# Patient Record
Sex: Female | Born: 1950
Health system: Southern US, Community
[De-identification: ages and names within clinical notes are randomized; demographics above are authoritative.]

## PROBLEM LIST (undated history)

## (undated) DIAGNOSIS — F32A Depression, unspecified: Secondary | ICD-10-CM

## (undated) DIAGNOSIS — K529 Noninfective gastroenteritis and colitis, unspecified: Secondary | ICD-10-CM

## (undated) DIAGNOSIS — I10 Essential (primary) hypertension: Secondary | ICD-10-CM

## (undated) DIAGNOSIS — J449 Chronic obstructive pulmonary disease, unspecified: Secondary | ICD-10-CM

## (undated) DIAGNOSIS — R079 Chest pain, unspecified: Secondary | ICD-10-CM

## (undated) DIAGNOSIS — F419 Anxiety disorder, unspecified: Secondary | ICD-10-CM

## (undated) DIAGNOSIS — F329 Major depressive disorder, single episode, unspecified: Secondary | ICD-10-CM

## (undated) DIAGNOSIS — M898X9 Other specified disorders of bone, unspecified site: Secondary | ICD-10-CM

## (undated) HISTORY — PX: COLONOSCOPY: SHX174

## (undated) HISTORY — PX: ABDOMINAL HYSTERECTOMY: SHX81

---
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
## 2019-06-24 ENCOUNTER — Encounter (HOSPITAL_BASED_OUTPATIENT_CLINIC_OR_DEPARTMENT_OTHER): Payer: Self-pay

## 2019-06-24 ENCOUNTER — Other Ambulatory Visit: Payer: Self-pay

## 2019-06-24 ENCOUNTER — Observation Stay (HOSPITAL_BASED_OUTPATIENT_CLINIC_OR_DEPARTMENT_OTHER)
Admission: EM | Admit: 2019-06-24 | Discharge: 2019-06-26 | Disposition: A | Discharge status: Home / Self Care | Payer: Medicare Other | Attending: Internal Medicine | Admitting: Internal Medicine

## 2019-06-24 DIAGNOSIS — F419 Anxiety disorder, unspecified: Secondary | ICD-10-CM | POA: Insufficient documentation

## 2019-06-24 DIAGNOSIS — R079 Chest pain, unspecified: Secondary | ICD-10-CM | POA: Insufficient documentation

## 2019-06-24 DIAGNOSIS — E611 Iron deficiency: Secondary | ICD-10-CM | POA: Diagnosis not present

## 2019-06-24 DIAGNOSIS — R718 Other abnormality of red blood cells: Secondary | ICD-10-CM | POA: Diagnosis not present

## 2019-06-24 DIAGNOSIS — Z90710 Acquired absence of both cervix and uterus: Secondary | ICD-10-CM | POA: Diagnosis not present

## 2019-06-24 DIAGNOSIS — Z87891 Personal history of nicotine dependence: Secondary | ICD-10-CM | POA: Insufficient documentation

## 2019-06-24 DIAGNOSIS — Z9981 Dependence on supplemental oxygen: Secondary | ICD-10-CM | POA: Diagnosis not present

## 2019-06-24 DIAGNOSIS — F329 Major depressive disorder, single episode, unspecified: Secondary | ICD-10-CM | POA: Diagnosis not present

## 2019-06-24 DIAGNOSIS — Z7982 Long term (current) use of aspirin: Secondary | ICD-10-CM | POA: Insufficient documentation

## 2019-06-24 DIAGNOSIS — J449 Chronic obstructive pulmonary disease, unspecified: Secondary | ICD-10-CM | POA: Diagnosis not present

## 2019-06-24 DIAGNOSIS — E876 Hypokalemia: Secondary | ICD-10-CM | POA: Diagnosis present

## 2019-06-24 DIAGNOSIS — Z79899 Other long term (current) drug therapy: Secondary | ICD-10-CM | POA: Insufficient documentation

## 2019-06-24 DIAGNOSIS — N183 Chronic kidney disease, stage 3 unspecified: Secondary | ICD-10-CM | POA: Diagnosis present

## 2019-06-24 DIAGNOSIS — R072 Precordial pain: Secondary | ICD-10-CM

## 2019-06-24 DIAGNOSIS — Z1159 Encounter for screening for other viral diseases: Secondary | ICD-10-CM | POA: Insufficient documentation

## 2019-06-24 DIAGNOSIS — I10 Essential (primary) hypertension: Secondary | ICD-10-CM | POA: Diagnosis present

## 2019-06-24 DIAGNOSIS — I129 Hypertensive chronic kidney disease with stage 1 through stage 4 chronic kidney disease, or unspecified chronic kidney disease: Secondary | ICD-10-CM | POA: Insufficient documentation

## 2019-06-24 HISTORY — DX: Noninfective gastroenteritis and colitis, unspecified: K52.9

## 2019-06-24 HISTORY — DX: Other specified disorders of bone, unspecified site: M89.8X9

## 2019-06-24 HISTORY — DX: Anxiety disorder, unspecified: F41.9

## 2019-06-24 HISTORY — DX: Essential (primary) hypertension: I10

## 2019-06-24 HISTORY — DX: Chronic obstructive pulmonary disease, unspecified: J44.9

## 2019-06-24 HISTORY — DX: Depression, unspecified: F32.A

## 2019-06-24 NOTE — ED Triage Notes (Signed)
Chest pain started 35 minutes prior to arrival. Describes as pressure. Constant, radiation to right arm. Dizziness, no nausea or vomiting.

## 2019-06-25 ENCOUNTER — Other Ambulatory Visit: Payer: Self-pay

## 2019-06-25 ENCOUNTER — Encounter (HOSPITAL_BASED_OUTPATIENT_CLINIC_OR_DEPARTMENT_OTHER): Payer: Self-pay | Admitting: Emergency Medicine

## 2019-06-25 ENCOUNTER — Observation Stay (HOSPITAL_BASED_OUTPATIENT_CLINIC_OR_DEPARTMENT_OTHER): Payer: Medicare Other

## 2019-06-25 ENCOUNTER — Emergency Department (HOSPITAL_BASED_OUTPATIENT_CLINIC_OR_DEPARTMENT_OTHER): Payer: Medicare Other

## 2019-06-25 DIAGNOSIS — N183 Chronic kidney disease, stage 3 unspecified: Secondary | ICD-10-CM | POA: Diagnosis present

## 2019-06-25 DIAGNOSIS — Z1159 Encounter for screening for other viral diseases: Secondary | ICD-10-CM | POA: Diagnosis not present

## 2019-06-25 DIAGNOSIS — E611 Iron deficiency: Secondary | ICD-10-CM | POA: Diagnosis not present

## 2019-06-25 DIAGNOSIS — R079 Chest pain, unspecified: Secondary | ICD-10-CM

## 2019-06-25 DIAGNOSIS — E876 Hypokalemia: Secondary | ICD-10-CM | POA: Diagnosis not present

## 2019-06-25 DIAGNOSIS — R718 Other abnormality of red blood cells: Secondary | ICD-10-CM | POA: Diagnosis not present

## 2019-06-25 DIAGNOSIS — I10 Essential (primary) hypertension: Secondary | ICD-10-CM | POA: Diagnosis present

## 2019-06-25 DIAGNOSIS — Z87891 Personal history of nicotine dependence: Secondary | ICD-10-CM | POA: Diagnosis not present

## 2019-06-25 DIAGNOSIS — Z7982 Long term (current) use of aspirin: Secondary | ICD-10-CM | POA: Diagnosis not present

## 2019-06-25 DIAGNOSIS — F329 Major depressive disorder, single episode, unspecified: Secondary | ICD-10-CM | POA: Diagnosis not present

## 2019-06-25 DIAGNOSIS — Z90710 Acquired absence of both cervix and uterus: Secondary | ICD-10-CM | POA: Diagnosis not present

## 2019-06-25 DIAGNOSIS — Z79899 Other long term (current) drug therapy: Secondary | ICD-10-CM | POA: Diagnosis not present

## 2019-06-25 DIAGNOSIS — I129 Hypertensive chronic kidney disease with stage 1 through stage 4 chronic kidney disease, or unspecified chronic kidney disease: Secondary | ICD-10-CM | POA: Diagnosis not present

## 2019-06-25 DIAGNOSIS — F419 Anxiety disorder, unspecified: Secondary | ICD-10-CM | POA: Diagnosis present

## 2019-06-25 DIAGNOSIS — J449 Chronic obstructive pulmonary disease, unspecified: Secondary | ICD-10-CM | POA: Diagnosis not present

## 2019-06-25 DIAGNOSIS — Z9981 Dependence on supplemental oxygen: Secondary | ICD-10-CM | POA: Diagnosis not present

## 2019-06-25 HISTORY — DX: Chest pain, unspecified: R07.9

## 2019-06-25 LAB — CBC WITH DIFFERENTIAL/PLATELET
Abs Immature Granulocytes: 0.01 10*3/uL (ref 0.00–0.07)
Basophils Absolute: 0 10*3/uL (ref 0.0–0.1)
Basophils Relative: 1 %
Eosinophils Absolute: 0.3 10*3/uL (ref 0.0–0.5)
Eosinophils Relative: 5 %
HCT: 41.2 % (ref 36.0–46.0)
Hemoglobin: 12.2 g/dL (ref 12.0–15.0)
Immature Granulocytes: 0 %
Lymphocytes Relative: 43 %
Lymphs Abs: 2.7 10*3/uL (ref 0.7–4.0)
MCH: 22.1 pg — ABNORMAL LOW (ref 26.0–34.0)
MCHC: 29.6 g/dL — ABNORMAL LOW (ref 30.0–36.0)
MCV: 74.8 fL — ABNORMAL LOW (ref 80.0–100.0)
Monocytes Absolute: 0.6 10*3/uL (ref 0.1–1.0)
Monocytes Relative: 9 %
Neutro Abs: 2.7 10*3/uL (ref 1.7–7.7)
Neutrophils Relative %: 42 %
Platelets: 284 10*3/uL (ref 150–400)
RBC: 5.51 MIL/uL — ABNORMAL HIGH (ref 3.87–5.11)
RDW: 16.1 % — ABNORMAL HIGH (ref 11.5–15.5)
WBC: 6.4 10*3/uL (ref 4.0–10.5)
nRBC: 0 % (ref 0.0–0.2)

## 2019-06-25 LAB — ECHOCARDIOGRAM COMPLETE
Height: 64 in
Weight: 3104 oz

## 2019-06-25 LAB — SARS CORONAVIRUS 2 BY RT PCR (HOSPITAL ORDER, PERFORMED IN ~~LOC~~ HOSPITAL LAB): SARS Coronavirus 2: NEGATIVE

## 2019-06-25 LAB — BASIC METABOLIC PANEL
Anion gap: 10 (ref 5–15)
BUN: 19 mg/dL (ref 8–23)
CO2: 20 mmol/L — ABNORMAL LOW (ref 22–32)
Calcium: 9.6 mg/dL (ref 8.9–10.3)
Chloride: 110 mmol/L (ref 98–111)
Creatinine, Ser: 1.42 mg/dL — ABNORMAL HIGH (ref 0.44–1.00)
GFR calc Af Amer: 45 mL/min — ABNORMAL LOW (ref >60)
GFR calc non Af Amer: 39 mL/min — ABNORMAL LOW (ref >60)
Glucose, Bld: 93 mg/dL (ref 70–99)
Potassium: 3.2 mmol/L — ABNORMAL LOW (ref 3.5–5.1)
Sodium: 140 mmol/L (ref 135–145)

## 2019-06-25 LAB — TROPONIN I (HIGH SENSITIVITY)
Troponin I (High Sensitivity): 7 ng/L (ref <18)
Troponin I (High Sensitivity): 8 ng/L (ref <18)

## 2019-06-25 MED ORDER — HYDROXYZINE HCL 10 MG PO TABS
10.0000 mg | ORAL_TABLET | Freq: Every day | ORAL | Status: DC
  Administered 2019-06-25: 10 mg via ORAL
  Filled 2019-06-25: qty 1

## 2019-06-25 MED ORDER — MESALAMINE ER 0.375 G PO CP24: 0.3750 g | ORAL_CAPSULE | Freq: Every day | ORAL | Status: DC

## 2019-06-25 MED ORDER — CLONAZEPAM 0.5 MG PO TABS
0.5000 mg | ORAL_TABLET | Freq: Two times a day (BID) | ORAL | Status: DC
  Administered 2019-06-25 – 2019-06-26 (×2): 0.5 mg via ORAL
  Filled 2019-06-25 (×2): qty 1

## 2019-06-25 MED ORDER — ASPIRIN EC 81 MG PO TBEC
81.0000 mg | DELAYED_RELEASE_TABLET | Freq: Every day | ORAL | Status: DC
  Administered 2019-06-26: 81 mg via ORAL
  Filled 2019-06-25: qty 1

## 2019-06-25 MED ORDER — TRAZODONE HCL 100 MG PO TABS
100.0000 mg | ORAL_TABLET | Freq: Every day | ORAL | Status: DC
  Administered 2019-06-25: 100 mg via ORAL
  Filled 2019-06-25: qty 1

## 2019-06-25 MED ORDER — DIPHENHYDRAMINE HCL 25 MG PO CAPS: 25.0000 mg | ORAL_CAPSULE | Freq: Four times a day (QID) | ORAL | Status: DC | PRN

## 2019-06-25 MED ORDER — ACETAMINOPHEN 325 MG PO TABS
650.0000 mg | ORAL_TABLET | Freq: Once | ORAL | Status: AC
  Administered 2019-06-25: 650 mg via ORAL
  Filled 2019-06-25: qty 2

## 2019-06-25 MED ORDER — ALUM & MAG HYDROXIDE-SIMETH 200-200-20 MG/5ML PO SUSP: 30.0000 mL | Freq: Four times a day (QID) | ORAL | Status: DC | PRN

## 2019-06-25 MED ORDER — CARVEDILOL 6.25 MG PO TABS
6.2500 mg | ORAL_TABLET | Freq: Two times a day (BID) | ORAL | Status: DC
  Administered 2019-06-26: 6.25 mg via ORAL
  Filled 2019-06-25: qty 1

## 2019-06-25 MED ORDER — QUETIAPINE FUMARATE 25 MG PO TABS
50.0000 mg | ORAL_TABLET | Freq: Every day | ORAL | Status: DC
  Administered 2019-06-25: 50 mg via ORAL
  Filled 2019-06-25: qty 2

## 2019-06-25 MED ORDER — ALBUTEROL SULFATE (2.5 MG/3ML) 0.083% IN NEBU: 2.5000 mg | INHALATION_SOLUTION | Freq: Two times a day (BID) | RESPIRATORY_TRACT | Status: DC | PRN

## 2019-06-25 MED ORDER — ENOXAPARIN SODIUM 40 MG/0.4ML ~~LOC~~ SOLN
40.0000 mg | SUBCUTANEOUS | Status: DC
  Administered 2019-06-25: 40 mg via SUBCUTANEOUS
  Filled 2019-06-25: qty 0.4

## 2019-06-25 MED ORDER — SENNA 8.6 MG PO TABS
2.0000 | ORAL_TABLET | Freq: Every morning | ORAL | Status: DC
  Administered 2019-06-26: 17.2 mg via ORAL
  Filled 2019-06-25: qty 2

## 2019-06-25 MED ORDER — TOPIRAMATE 25 MG PO TABS
200.0000 mg | ORAL_TABLET | Freq: Every day | ORAL | Status: DC
  Administered 2019-06-25: 200 mg via ORAL
  Filled 2019-06-25: qty 8

## 2019-06-25 MED ORDER — POTASSIUM CHLORIDE CRYS ER 20 MEQ PO TBCR
40.0000 meq | EXTENDED_RELEASE_TABLET | ORAL | Status: AC
  Administered 2019-06-25: 40 meq via ORAL
  Filled 2019-06-25: qty 2

## 2019-06-25 MED ORDER — ASPIRIN 81 MG PO CHEW
324.0000 mg | CHEWABLE_TABLET | Freq: Once | ORAL | Status: AC
  Administered 2019-06-25: 324 mg via ORAL
  Filled 2019-06-25: qty 4

## 2019-06-25 MED ORDER — ACETAMINOPHEN 325 MG PO TABS: 650.0000 mg | ORAL_TABLET | ORAL | Status: DC | PRN

## 2019-06-25 MED ORDER — TIOTROPIUM BROMIDE MONOHYDRATE 18 MCG IN CAPS: 18.0000 ug | ORAL_CAPSULE | Freq: Every day | RESPIRATORY_TRACT | Status: DC

## 2019-06-25 MED ORDER — MESALAMINE 400 MG PO CPDR
2250.0000 mg | DELAYED_RELEASE_CAPSULE | Freq: Every day | ORAL | Status: DC
  Filled 2019-06-25: qty 6

## 2019-06-25 MED ORDER — TOPIRAMATE 25 MG PO TABS
100.0000 mg | ORAL_TABLET | Freq: Every day | ORAL | Status: DC
  Administered 2019-06-26: 100 mg via ORAL
  Filled 2019-06-25: qty 4

## 2019-06-25 MED ORDER — ALBUTEROL SULFATE 0.63 MG/3ML IN NEBU: 0.6300 mg | INHALATION_SOLUTION | Freq: Two times a day (BID) | RESPIRATORY_TRACT | Status: DC | PRN

## 2019-06-25 MED ORDER — UMECLIDINIUM BROMIDE 62.5 MCG/INH IN AEPB
1.0000 | INHALATION_SPRAY | Freq: Every day | RESPIRATORY_TRACT | Status: DC
  Administered 2019-06-26: 1 via RESPIRATORY_TRACT
  Filled 2019-06-25: qty 7

## 2019-06-25 MED ORDER — TIZANIDINE HCL 4 MG PO TABS
2.0000 mg | ORAL_TABLET | Freq: Two times a day (BID) | ORAL | Status: DC
  Administered 2019-06-25 – 2019-06-26 (×2): 2 mg via ORAL
  Filled 2019-06-25 (×2): qty 1

## 2019-06-25 MED ORDER — ONDANSETRON HCL 4 MG/2ML IJ SOLN: 4.0000 mg | Freq: Four times a day (QID) | INTRAMUSCULAR | Status: DC | PRN

## 2019-06-25 MED ORDER — NITROGLYCERIN 0.4 MG SL SUBL
0.4000 mg | SUBLINGUAL_TABLET | SUBLINGUAL | Status: DC | PRN
  Filled 2019-06-25: qty 1

## 2019-06-25 NOTE — Progress Notes (Signed)
Pt arrived to the unit. AAOx3  

## 2019-06-25 NOTE — H&P (Signed)
History and Physical    Jean Stafford BZJ:[id number] DOB: 1951-02-07 DOA: 06/24/2019  Referring MD/NP/PA: Noberto Retort, MD PCP: System, Pcp Not In  Patient coming from: [facility] transfer  Chief Complaint:  Chest pain I have personally briefly reviewed patient's old medical records in [place]   HPI: Jean Stafford is a 68 y.o. female with medical history significant of hypertension, COPD, CKD stage III, anxiety, and depression; who presented with complaints of chest pain and pressure which started last night while she is on the phone with a friend.  Chest pain reportedly radiated into her right arm.  Associated symptoms included some dizziness and some mild shortness of breath.  Denied any nausea, vomiting, or diaphoresis.  Pain symptoms ultimately spontaneously resolved on their own.  She reports being under a lot of stress with her father dying currently on hospice.  ED Course: High-sensitivity troponins negative x2.  EKG without any significant ischemic changes.  Labs revealed low MCV/MCH, potassium 3.2, BUN 19, and creatinine 1.42.  Patient was given full dose aspirin and Tylenol.  Review of Systems  Constitutional: Negative for chills and fever.  HENT: Negative for congestion and nosebleeds.   Respiratory: Positive for shortness of breath. Negative for cough.   Cardiovascular: Positive for chest pain. Negative for leg swelling.  Gastrointestinal: Negative for abdominal pain, nausea and vomiting.  Genitourinary: Negative for dysuria and frequency.  Musculoskeletal: Negative for falls.  Skin: Negative for itching and rash.  Neurological: Positive for dizziness. Negative for loss of consciousness and headaches.  Psychiatric/Behavioral: Negative for hallucinations and substance abuse.       Positive for stress    Past Medical History:  Diagnosis Date  . Anxiety   . Chest pain 06/25/2019  . Colitis   . COPD (chronic obstructive pulmonary disease) ([place])   . Degenerative disorder of  bone   . Depression   . Hypertension     Past Surgical History:  Procedure Laterality Date  . ABDOMINAL HYSTERECTOMY    . COLONOSCOPY       reports that she has quit smoking. She has never used smokeless tobacco. She reports previous alcohol use. She reports that she does not use drugs.  No Known Allergies  History reviewed. No pertinent family history.   Current Outpatient Medications on File Prior to Encounter  Medication Sig Dispense Refill  . acetaminophen (TYLENOL) 500 MG tablet Take 500-1,000 mg by mouth every 6 (six) hours as needed for mild pain or headache.    . albuterol (ACCUNEB) 0.63 MG/3ML nebulizer solution Inhale 0.63 mg into the lungs 2 (two) times daily as needed for wheezing or shortness of breath.     Marland Kitchen albuterol (PROAIR HFA) 108 (90 Base) MCG/ACT inhaler Inhale 2 puffs into the lungs every 6 (six) hours as needed for wheezing or shortness of breath.    Marland Kitchen aspirin EC 81 MG tablet Take 81 mg by mouth daily.    . carvedilol (COREG) 6.25 MG tablet Take 6.25 mg by mouth 2 (two) times daily with a meal.    . Cholecalciferol (DIALYVITE VITAMIN D3 MAX) 1.25 MG (50000 UT) TABS Take 50,000 Units by mouth every 7 (seven) days.    . clonazePAM (KLONOPIN) 0.5 MG tablet Take 0.5 mg by mouth See admin instructions. Take 0.5 mg by mouth in the morning and 0.5 mg at bedtime    . diphenhydrAMINE (BENADRYL) 25 mg capsule Take 25 mg by mouth every 6 (six) hours as needed for allergies.    . hydrOXYzine (  ATARAX/VISTARIL) 10 MG tablet Take 10 mg by mouth at bedtime.     . meloxicam (MOBIC) 15 MG tablet Take 15 mg by mouth daily as needed for pain (or inflammation).     . mesalamine (APRISO) 0.375 g 24 hr capsule Take 0.375 g by mouth 6 (six) times daily.    . Mometasone Furoate (ASMANEX HFA) 100 MCG/ACT AERO Inhale 2 puffs into the lungs daily as needed (for flares).    . OXYGEN Inhale 2 L/min into the lungs at bedtime.    Marland Kitchen. QUEtiapine (SEROQUEL) 50 MG tablet Take 50 mg by mouth at  bedtime.    . senna (SENOKOT) 8.6 MG TABS tablet Take 2 tablets by mouth every morning.    . tiotropium (SPIRIVA) 18 MCG inhalation capsule Place 18 mcg into inhaler and inhale daily.    Marland Kitchen. tiZANidine (ZANAFLEX) 2 MG tablet Take 2 mg by mouth 2 (two) times a day.    . topiramate (TOPAMAX) 100 MG tablet Take 100-200 mg by mouth See admin instructions. Take 100 mg by mouth in the morning and 200 mg at bedtime    . traZODone (DESYREL) 100 MG tablet Take 100 mg by mouth at bedtime.    . triamcinolone cream (KENALOG) 0.1 % Apply 1 application topically See admin instructions. Apply to affected areas 2 times a day       Physical Exam:  Constitutional: Elderly female in no acute distress NAD, calm, comfortable Vitals:   06/25/19 1130 06/25/19 1140 06/25/19 1200 06/25/19 1330  BP: 98/87 140/71 139/79 (!) 142/96  Pulse: 78 64 71 73  Resp: 13 18 (!) 21 20  Temp:    98.4 F (36.9 C)  TempSrc:    Oral  SpO2: 100% 100% 99% 99%  Weight:    88 kg  Height:    5\' 4"  (1.626 m)   Eyes: PERRL, lids and conjunctivae normal ENMT: Mucous membranes are moist. Posterior pharynx clear of any exudate or lesions.  Neck: normal, supple, no masses, no thyromegaly Respiratory: clear to auscultation bilaterally, no wheezing, no crackles. Normal respiratory effort. No accessory muscle use.  Cardiovascular: Regular rate and rhythm, no murmurs / rubs / gallops. No extremity edema. 2+ pedal pulses. No carotid bruits.  Abdomen: no tenderness, no masses palpated. No hepatosplenomegaly. Bowel sounds positive.  Musculoskeletal: no clubbing / cyanosis. No joint deformity upper and lower extremities. Good ROM, no contractures. Normal muscle tone.  Skin: no rashes, lesions, ulcers. No induration Neurologic: CN 2-12 grossly intact. Sensation intact, DTR normal. Strength 5/5 in all 4.  Psychiatric: Normal judgment and insight. Alert and oriented x 3.  Anxious mood.     Labs on Admission: I have personally reviewed following  labs and imaging studies  CBC: Recent Labs  Lab 06/25/19 0026  WBC 6.4  NEUTROABS 2.7  HGB 12.2  HCT 41.2  MCV 74.8*  PLT 284   Basic Metabolic Panel: Recent Labs  Lab 06/25/19 0026  NA 140  K 3.2*  CL 110  CO2 20*  GLUCOSE 93  BUN 19  CREATININE 1.42*  CALCIUM 9.6   GFR: Estimated Creatinine Clearance: 42.4 mL/min (A) (by C-G formula based on SCr of 1.42 mg/dL (H)). Liver Function Tests: No results for input(s): AST, ALT, ALKPHOS, BILITOT, PROT, ALBUMIN in the last 168 hours. No results for input(s): LIPASE, AMYLASE in the last 168 hours. No results for input(s): AMMONIA in the last 168 hours. Coagulation Profile: No results for input(s): INR, PROTIME in the last 168 hours.  Cardiac Enzymes: No results for input(s): CKTOTAL, CKMB, CKMBINDEX, TROPONINI in the last 168 hours. BNP (last 3 results) No results for input(s): PROBNP in the last 8760 hours. HbA1C: No results for input(s): HGBA1C in the last 72 hours. CBG: No results for input(s): GLUCAP in the last 168 hours. Lipid Profile: No results for input(s): CHOL, HDL, LDLCALC, TRIG, CHOLHDL, LDLDIRECT in the last 72 hours. Thyroid Function Tests: No results for input(s): TSH, T4TOTAL, FREET4, T3FREE, THYROIDAB in the last 72 hours. Anemia Panel: No results for input(s): VITAMINB12, FOLATE, FERRITIN, TIBC, IRON, RETICCTPCT in the last 72 hours. Urine analysis: No results found for: COLORURINE, APPEARANCEUR, LABSPEC, PHURINE, GLUCOSEU, HGBUR, BILIRUBINUR, KETONESUR, PROTEINUR, UROBILINOGEN, NITRITE, LEUKOCYTESUR Sepsis Labs: Recent Results (from the past 240 hour(s))  SARS Coronavirus 2 (CEPHEID - Performed in [facility] hospital lab), Hosp Order     Status: None   Collection Time: 06/25/19  4:27 AM   Specimen: Nasopharyngeal Swab  Result Value Ref Range Status   SARS Coronavirus 2 NEGATIVE NEGATIVE Final    Comment: (NOTE) If result is NEGATIVE SARS-CoV-2 target nucleic acids are NOT DETECTED. The  SARS-CoV-2 RNA is generally detectable in upper and lower  respiratory specimens during the acute phase of infection. The lowest  concentration of SARS-CoV-2 viral copies this assay can detect is 250  copies / mL. A negative result does not preclude SARS-CoV-2 infection  and should not be used as the sole basis for treatment or other  patient management decisions.  A negative result may occur with  improper specimen collection / handling, submission of specimen other  than nasopharyngeal swab, presence of viral mutation(s) within the  areas targeted by this assay, and inadequate number of viral copies  (<250 copies / mL). A negative result must be combined with clinical  observations, patient history, and epidemiological information. If result is POSITIVE SARS-CoV-2 target nucleic acids are DETECTED. The SARS-CoV-2 RNA is generally detectable in upper and lower  respiratory specimens dur ing the acute phase of infection.  Positive  results are indicative of active infection with SARS-CoV-2.  Clinical  correlation with patient history and other diagnostic information is  necessary to determine patient infection status.  Positive results do  not rule out bacterial infection or co-infection with other viruses. If result is PRESUMPTIVE POSTIVE SARS-CoV-2 nucleic acids MAY BE PRESENT.   A presumptive positive result was obtained on the submitted specimen  and confirmed on repeat testing.  While 2019 novel coronavirus  (SARS-CoV-2) nucleic acids may be present in the submitted sample  additional confirmatory testing may be necessary for epidemiological  and / or clinical management purposes  to differentiate between  SARS-CoV-2 and other Sarbecovirus currently known to infect humans.  If clinically indicated additional testing with an alternate test  methodology [phone]([id number]) is advised. The SARS-CoV-2 RNA is generally  detectable in upper and lower respiratory sp ecimens during the acute   phase of infection. The expected result is Negative. Fact Sheet for Patients:  BoilerBrush.com.cyhttps://www.fda.gov/media/136312/download Fact Sheet for Healthcare Providers: https://pope.com/https://www.fda.gov/media/136313/download This test is not yet approved or cleared by the [place] FDA and has been authorized for detection and/or diagnosis of SARS-CoV-2 by FDA under an Emergency Use Authorization (EUA).  This EUA will remain in effect (meaning this test can be used) for the duration of the COVID-19 declaration under Section 564(b)(1) of the Act, 21 U.S.C. section 360bbb-3(b)(1), unless the authorization is terminated or revoked sooner. Performed at [facility] [place], [address] [place],  [place] [id number]      Radiological Exams on Admission: Dg Chest 2 View  Result Date: 06/25/2019 CLINICAL DATA:  68 y/o  F; chest pain. EXAM: CHEST - 2 VIEW COMPARISON:  None. FINDINGS: The heart size and mediastinal contours are within normal limits. Both lungs are clear. The visualized skeletal structures are unremarkable. IMPRESSION: No acute pulmonary process identified. Electronically Signed   By: Mitzi HansenLance  Furusawa-Stratton M.D.   On: 06/25/2019 00:24    EKG: Independently reviewed.  Normal sinus rhythm at 68 bpm  Assessment/Plan Chest pain: Acute.  Patient presented with complaints of chest pain.  EKG negative for any acute ischemic changes and high-sensitivity troponin negative x2.  Suspect symptoms may likely be stress-induced. -Admit to a telemetry bed -Continue aspirin and -Check echocardiogram -Follow-up telemetry overnight -May benefit from outpatient referral to cardiology  Hypokalemia: Acute.  Potassium 3.2 on admission. -Give 40 mEq of potassium -Continue to monitor and replace as needed  Essential hypertension -Continue Coreg  COPD, without acute exacerbation -Continue home inhalers  Chronic kidney disease stage III: Creatinine 1.42 on admission.  Patient's reports history of kidney  disease, but baseline is not known. -Recheck creatinine in a.m.  Iron deficiency: Patient noted to have signs of low MCV and MCH with elevated RDW on admission. -Recommend iron supplementation at discharge  Anxiety -Continue Klonopin as needed and Seroquel at night   DVT prophylaxis: Lovenox Code Status: Full Family Communication: No family present at bedside Disposition Plan: Likely discharge home in 1 to 2 days if work-up negative Consults called: None Admission status: Observation  Clydie Braunondell A Tyteanna Ost MD [facility] Pager [phone]   If 7PM-7AM, please contact night-coverage [email or web address] Password [email or web address]  06/25/2019, 2:02 PM

## 2019-06-25 NOTE — Progress Notes (Signed)
  Echocardiogram 2D Echocardiogram has been performed.  Jean Stafford 06/25/2019, 4:37 PM

## 2019-06-25 NOTE — ED Notes (Signed)
Pt returned from the bathroom and states her head now hurts with a pain scale of 7, Notified RN of this. Also updates VS.

## 2019-06-25 NOTE — ED Provider Notes (Signed)
[facility] Provider Note: Lowella DellJ. Lane Montague Corella, MD, FACEP  CSN: [id number] MRN: [id number] ARRIVAL: 06/24/19 at 2345 ROOM: MH03/MH03   CHIEF COMPLAINT  Chest Pain   HISTORY OF PRESENT ILLNESS  06/25/19 12:44 AM Jean Stafford is a 68 y.o. female with a history of COPD.  She is here with chest pain that began about 90 minutes ago.  She describes the chest pain as being in her lower chest and feeling like both pressure and pain (her words).  She tells me it did not radiate although nurse's notes indicate radiation to the right arm.  She has had no associated shortness of breath, nausea or diaphoresis but has had dizziness which is vaguely described.  She rates her pain as severe at its worst but it is not present now.  It has been intermittent since onset.  She is not aware of anything that makes the pain better or worse.   Past Medical History:  Diagnosis Date  . Anxiety   . COPD (chronic obstructive pulmonary disease) ([facility])   . Depression   . Hypertension     History reviewed. No pertinent surgical history.  No family history on file.  Social History   Tobacco Use  . Smoking status: Not on file  Substance Use Topics  . Alcohol use: Not on file  . Drug use: Not on file    Prior to Admission medications   Not on File    Allergies Patient has no known allergies.   REVIEW OF SYSTEMS  Negative except as noted here or in the History of Present Illness.   PHYSICAL EXAMINATION  Initial Vital Signs Blood pressure (!) 161/73, pulse 81, temperature (!) 97 F (36.1 C), temperature source Oral, resp. rate 20, height 5\' 4"  (1.626 m), weight 80.7 kg, SpO2 100 %.  Examination General: Well-developed, well-nourished female in no acute distress; appearance consistent with age of record HENT: normocephalic; atraumatic Eyes: pupils equal, round and reactive to light; extraocular muscles intact Neck: supple Heart: regular rate and rhythm; no murmur Lungs: clear to  auscultation bilaterally Chest: Right lower parasternal tenderness without rash or erythema Abdomen: soft; nondistended; nontender; bowel sounds present Extremities: No deformity; full range of motion; pulses normal Neurologic: Awake, alert and oriented; motor function intact in all extremities and symmetric; no facial droop Skin: Warm and dry Psychiatric: Normal mood and affect   RESULTS  Summary of this visit's results, reviewed by myself:   EKG Interpretation  Date/Time:  Tuesday June 24 2019 23:57:05 EDT Ventricular Rate:  68 PR Interval:  176 QRS Duration: 80 QT Interval:  398 QTC Calculation: 423 R Axis:   -6 Text Interpretation:  Normal sinus rhythm Moderate voltage criteria for LVH, may be normal variant Borderline ECG No previous ECGs available Confirmed by Paula LibraMolpus, Shephanie Romas ([id number]) on 06/25/2019 12:06:08 AM      Laboratory Studies: Results for orders placed or performed during the hospital encounter of 06/24/19 (from the past 24 hour(s))  Basic metabolic panel     Status: Abnormal   Collection Time: 06/25/19 12:26 AM  Result Value Ref Range   Sodium 140 135 - 145 mmol/L   Potassium 3.2 (L) 3.5 - 5.1 mmol/L   Chloride 110 98 - 111 mmol/L   CO2 20 (L) 22 - 32 mmol/L   Glucose, Bld 93 70 - 99 mg/dL   BUN 19 8 - 23 mg/dL   Creatinine, Ser 6.211.42 (H) 0.44 - 1.00 mg/dL   Calcium 9.6 8.9 - 30.810.3 mg/dL  GFR calc non Af Amer 39 (L) >60 mL/min   GFR calc Af Amer 45 (L) >60 mL/min   Anion gap 10 5 - 15  Troponin I (High Sensitivity)     Status: None   Collection Time: 06/25/19 12:26 AM  Result Value Ref Range   Troponin I (High Sensitivity) 7 <18 ng/L  CBC with Differential/Platelet     Status: Abnormal   Collection Time: 06/25/19 12:26 AM  Result Value Ref Range   WBC 6.4 4.0 - 10.5 K/uL   RBC 5.51 (H) 3.87 - 5.11 MIL/uL   Hemoglobin 12.2 12.0 - 15.0 g/dL   HCT 41.2 36.0 - 46.0 %   MCV 74.8 (L) 80.0 - 100.0 fL   MCH 22.1 (L) 26.0 - 34.0 pg   MCHC 29.6 (L) 30.0 - 36.0  g/dL   RDW 16.1 (H) 11.5 - 15.5 %   Platelets 284 150 - 400 K/uL   nRBC 0.0 0.0 - 0.2 %   Neutrophils Relative % 42 %   Neutro Abs 2.7 1.7 - 7.7 K/uL   Lymphocytes Relative 43 %   Lymphs Abs 2.7 0.7 - 4.0 K/uL   Monocytes Relative 9 %   Monocytes Absolute 0.6 0.1 - 1.0 K/uL   Eosinophils Relative 5 %   Eosinophils Absolute 0.3 0.0 - 0.5 K/uL   Basophils Relative 1 %   Basophils Absolute 0.0 0.0 - 0.1 K/uL   Immature Granulocytes 0 %   Abs Immature Granulocytes 0.01 0.00 - 0.07 K/uL  Troponin I (High Sensitivity)     Status: None   Collection Time: 06/25/19  2:19 AM  Result Value Ref Range   Troponin I (High Sensitivity) 8 <18 ng/L  SARS Coronavirus 2 (CEPHEID - Performed in [place] hospital lab), Hosp Order     Status: None   Collection Time: 06/25/19  4:27 AM   Specimen: Nasopharyngeal Swab  Result Value Ref Range   SARS Coronavirus 2 NEGATIVE NEGATIVE   Imaging Studies: Dg Chest 2 View  Result Date: 06/25/2019 CLINICAL DATA:  68 y/o  F; chest pain. EXAM: CHEST - 2 VIEW COMPARISON:  None. FINDINGS: The heart size and mediastinal contours are within normal limits. Both lungs are clear. The visualized skeletal structures are unremarkable. IMPRESSION: No acute pulmonary process identified. Electronically Signed   By: Kristine Garbe M.D.   On: 06/25/2019 00:24    ED COURSE and MDM  Nursing notes and initial vitals signs, including pulse oximetry, reviewed.  Vitals:   06/25/19 0300 06/25/19 0330 06/25/19 0400 06/25/19 0500  BP: (!) 146/87 (!) 152/80 126/81 (!) 165/99  Pulse: 80 73 75 81  Resp: 18 19 19 18   Temp:      TempSrc:      SpO2: 98% 97% 97% 96%  Weight:      Height:       3:25 AM Patient has remained pain-free since initial evaluation.  Both troponins are well within normal limits without significant delta.  The HEART pathway recommends inpatient observation in this patient given that she is visiting from out of town and has no local follow-up.  She is  amenable to admission.  PROCEDURES    ED DIAGNOSES     ICD-10-CM   1. Precordial pain  R07.2   2. RBC microcytosis  R71.8   3. Hypokalemia  E87.6        Edwyn Inclan, Jenny Reichmann, MD 06/25/19 [phone]

## 2019-06-26 DIAGNOSIS — J431 Panlobular emphysema: Secondary | ICD-10-CM | POA: Diagnosis not present

## 2019-06-26 DIAGNOSIS — E876 Hypokalemia: Secondary | ICD-10-CM | POA: Diagnosis not present

## 2019-06-26 DIAGNOSIS — F419 Anxiety disorder, unspecified: Secondary | ICD-10-CM | POA: Diagnosis not present

## 2019-06-26 DIAGNOSIS — R0782 Intercostal pain: Secondary | ICD-10-CM | POA: Diagnosis not present

## 2019-06-26 LAB — BASIC METABOLIC PANEL
Anion gap: 5 (ref 5–15)
BUN: 18 mg/dL (ref 8–23)
CO2: 19 mmol/L — ABNORMAL LOW (ref 22–32)
Calcium: 9.2 mg/dL (ref 8.9–10.3)
Chloride: 116 mmol/L — ABNORMAL HIGH (ref 98–111)
Creatinine, Ser: 1.36 mg/dL — ABNORMAL HIGH (ref 0.44–1.00)
GFR calc Af Amer: 47 mL/min — ABNORMAL LOW (ref >60)
GFR calc non Af Amer: 41 mL/min — ABNORMAL LOW (ref >60)
Glucose, Bld: 103 mg/dL — ABNORMAL HIGH (ref 70–99)
Potassium: 3.8 mmol/L (ref 3.5–5.1)
Sodium: 140 mmol/L (ref 135–145)

## 2019-06-26 LAB — HIV ANTIBODY (ROUTINE TESTING W REFLEX): HIV Screen 4th Generation wRfx: NONREACTIVE

## 2019-06-26 MED ORDER — MESALAMINE ER 0.375 G PO CP24: 2.2500 g | ORAL_CAPSULE | Freq: Every day | ORAL | 0 refills | Status: AC

## 2019-06-26 MED ORDER — TOPIRAMATE 100 MG PO TABS: 100.0000 mg | ORAL_TABLET | ORAL | 0 refills | Status: AC

## 2019-06-26 MED ORDER — ALBUTEROL SULFATE HFA 108 (90 BASE) MCG/ACT IN AERS: 2.0000 | INHALATION_SPRAY | Freq: Four times a day (QID) | RESPIRATORY_TRACT | 3 refills | Status: AC | PRN

## 2019-06-26 MED ORDER — QUETIAPINE FUMARATE 50 MG PO TABS: 50.0000 mg | ORAL_TABLET | Freq: Every day | ORAL | 0 refills | Status: AC

## 2019-06-26 MED ORDER — TIZANIDINE HCL 2 MG PO TABS: 2.0000 mg | ORAL_TABLET | Freq: Two times a day (BID) | ORAL | 0 refills | Status: AC

## 2019-06-26 MED ORDER — CLONAZEPAM 0.5 MG PO TABS: 0.5000 mg | ORAL_TABLET | ORAL | 0 refills | Status: AC

## 2019-06-26 MED FILL — ALBUTEROL SULFATE HFA 108 (: 108 (90 BAS | 25 days supply | Qty: 9 | Fill #0

## 2019-06-26 MED FILL — tiZANidine HCL 2 MG TABS: 2 | 10 days supply | Qty: 20 | Fill #0

## 2019-06-26 MED FILL — clonazePAM 0.5 MG TABS: 0.5 | 30 days supply | Qty: 60 | Fill #0

## 2019-06-26 MED FILL — QUETIAPINE FUMARATE 50 MG T: 50 | 30 days supply | Qty: 30 | Fill #0

## 2019-06-26 MED FILL — MESALAMINE ER 0.375 GM CP24: 0.375 | 30 days supply | Qty: 180 | Fill #0

## 2019-06-26 NOTE — Plan of Care (Signed)
  Problem: Activity: Goal: Risk for activity intolerance will decrease Outcome: Progressing   Problem: Nutrition: Goal: Adequate nutrition will be maintained Outcome: Progressing   Problem: Coping: Goal: Level of anxiety will decrease Outcome: Progressing   Problem: Pain Managment: Goal: General experience of comfort will improve Outcome: Progressing   Problem: Safety: Goal: Ability to remain free from injury will improve Outcome: Progressing   

## 2019-06-26 NOTE — Discharge Summary (Signed)
Physician Discharge Summary  Jean Stafford ZOX:[id number] DOB: 08/07/1951 DOA: 06/24/2019  PCP: System, Pcp Not In  Admit date: 06/24/2019 Discharge date: 06/26/2019  Admitted From: home  Disposition:  Home   Recommendations for Outpatient Follow-up:  1. Follow up with PCP in 1-2 weeks 2. If you not able to go home to [place], make a PCP here.   Home Health: Not applicable Equipment/Devices: Not applicable  Discharge Condition: Stable CODE STATUS: Full code Diet recommendation: Low-salt diet  Discharge summary: 68 year old female with a history of hypertension, COPD, stage III CKD, anxiety and depression who is currently staying with her dying father who is under hospice and visiting from [place].  She had acute onset midsternal chest pain that persisted so she came to ER. Patient was kept on observation, initial troponins and follow-up troponins were nonischemic.  EKG and follow-up EKG were nonischemic.  Patient was asymptomatic since admission.  This was probably atypical chest pain.  Acute coronary syndrome ruled out.  Patient was discharged home.  She was given a refill of her medications including Klonopin as she was away from her state.  Electrolytes were replaced and normalized.  Renal functions at her baseline.  Discharge Diagnoses:  Principal Problem:   Chest pain Active Problems:   Hypokalemia   Benign essential HTN   COPD (chronic obstructive pulmonary disease) (HCC)   Anxiety   CKD (chronic kidney disease), stage III [facility](HCC)    Discharge Instructions  Discharge Instructions    Call MD for:  difficulty breathing, headache or visual disturbances   Complete by: As directed    Call MD for:  severe uncontrolled pain   Complete by: As directed    Diet - low sodium heart healthy   Complete by: As directed    Increase activity slowly   Complete by: As directed      Allergies as of 06/26/2019   No Known Allergies     Medication List    STOP taking these  medications   meloxicam 15 MG tablet Commonly known as: MOBIC     TAKE these medications   acetaminophen 500 MG tablet Commonly known as: TYLENOL Take 500-1,000 mg by mouth every 6 (six) hours as needed for mild pain or headache.   albuterol 0.63 MG/3ML nebulizer solution Commonly known as: ACCUNEB Inhale 0.63 mg into the lungs 2 (two) times daily as needed for wheezing or shortness of breath.   albuterol 108 (90 Base) MCG/ACT inhaler Commonly known as: ProAir HFA Inhale 2 puffs into the lungs every 6 (six) hours as needed for wheezing or shortness of breath.   Asmanex HFA 100 MCG/ACT Aero Generic drug: Mometasone Furoate Inhale 2 puffs into the lungs daily as needed (for flares).   aspirin EC 81 MG tablet Take 81 mg by mouth daily.   carvedilol 6.25 MG tablet Commonly known as: COREG Take 6.25 mg by mouth 2 (two) times daily with a meal.   clonazePAM 0.5 MG tablet Commonly known as: KLONOPIN Take 1 tablet (0.5 mg total) by mouth See admin instructions. Take 0.5 mg by mouth in the morning and 0.5 mg at bedtime   Dialyvite Vitamin D3 Max 1.25 MG (50000 UT) Tabs Generic drug: Cholecalciferol Take 50,000 Units by mouth every 7 (seven) days.   diphenhydrAMINE 25 mg capsule Commonly known as: BENADRYL Take 25 mg by mouth every 6 (six) hours as needed for allergies.   hydrOXYzine 10 MG tablet Commonly known as: ATARAX/VISTARIL Take 10 mg by mouth at bedtime.  mesalamine 0.375 g 24 hr capsule Commonly known as: APRISO Take 6 capsules (2.25 g total) by mouth daily.   OXYGEN Inhale 2 L/min into the lungs at bedtime.   QUEtiapine 50 MG tablet Commonly known as: SEROQUEL Take 1 tablet (50 mg total) by mouth at bedtime.   senna 8.6 MG Tabs tablet Commonly known as: SENOKOT Take 2 tablets by mouth every morning.   tiotropium 18 MCG inhalation capsule Commonly known as: SPIRIVA Place 18 mcg into inhaler and inhale daily.   tiZANidine 2 MG tablet Commonly known as:  ZANAFLEX Take 1 tablet (2 mg total) by mouth 2 (two) times a day for 10 days.   topiramate 100 MG tablet Commonly known as: TOPAMAX Take 1-2 tablets (100-200 mg total) by mouth See admin instructions. Take 100 mg by mouth in the morning and 200 mg at bedtime   traZODone 100 MG tablet Commonly known as: DESYREL Take 100 mg by mouth at bedtime.   triamcinolone cream 0.1 % Commonly known as: KENALOG Apply 1 application topically See admin instructions. Apply to affected areas 2 times a day       No Known Allergies  Consultations:  None   Procedures/Studies: Dg Chest 2 View  Result Date: 06/25/2019 CLINICAL DATA:  68 y/o  F; chest pain. EXAM: CHEST - 2 VIEW COMPARISON:  None. FINDINGS: The heart size and mediastinal contours are within normal limits. Both lungs are clear. The visualized skeletal structures are unremarkable. IMPRESSION: No acute pulmonary process identified. Electronically Signed   By: Kristine Garbe M.D.   On: 06/25/2019 00:24    (Echo, Carotid, EGD, Colonoscopy, ERCP)    Subjective: Patient seen and examined on the day of discharge.  She had no more chest pain since coming to the emergency room.  Patient is asking for refill on her medications as she is not able to go back see her primary care physician in [place].   Discharge Exam: Vitals:   06/26/19 0005 06/26/19 0412  BP: 117/78 107/70  Pulse: 85 82  Resp: 18 18  Temp: 98.3 F (36.8 C) 97.8 F (36.6 C)  SpO2: 95% 97%   Vitals:   06/25/19 1641 06/25/19 1940 06/26/19 0005 06/26/19 0412  BP: (!) 163/95 135/73 117/78 107/70  Pulse: 74 78 85 82  Resp: 19 19 18 18   Temp: 98.4 F (36.9 C) 98.4 F (36.9 C) 98.3 F (36.8 C) 97.8 F (36.6 C)  TempSrc: Oral Oral Oral Oral  SpO2: 100% 99% 95% 97%  Weight:    87.9 kg  Height:        General: Pt is alert, awake, not in acute distress, on room air. Cardiovascular: RRR, S1/S2 +, no rubs, no gallops Respiratory: CTA bilaterally, no  wheezing, no rhonchi Abdominal: Soft, NT, ND, bowel sounds + Extremities: no edema, no cyanosis    The results of significant diagnostics from this hospitalization (including imaging, microbiology, ancillary and laboratory) are listed below for reference.     Microbiology: Recent Results (from the past 240 hour(s))  SARS Coronavirus 2 (CEPHEID - Performed in [place] hospital lab), Hosp Order     Status: None   Collection Time: 06/25/19  4:27 AM   Specimen: Nasopharyngeal Swab  Result Value Ref Range Status   SARS Coronavirus 2 NEGATIVE NEGATIVE Final    Comment: (NOTE) If result is NEGATIVE SARS-CoV-2 target nucleic acids are NOT DETECTED. The SARS-CoV-2 RNA is generally detectable in upper and lower  respiratory specimens during the acute phase of  infection. The lowest  concentration of SARS-CoV-2 viral copies this assay can detect is 250  copies / mL. A negative result does not preclude SARS-CoV-2 infection  and should not be used as the sole basis for treatment or other  patient management decisions.  A negative result may occur with  improper specimen collection / handling, submission of specimen other  than nasopharyngeal swab, presence of viral mutation(s) within the  areas targeted by this assay, and inadequate number of viral copies  (<250 copies / mL). A negative result must be combined with clinical  observations, patient history, and epidemiological information. If result is POSITIVE SARS-CoV-2 target nucleic acids are DETECTED. The SARS-CoV-2 RNA is generally detectable in upper and lower  respiratory specimens dur ing the acute phase of infection.  Positive  results are indicative of active infection with SARS-CoV-2.  Clinical  correlation with patient history and other diagnostic information is  necessary to determine patient infection status.  Positive results do  not rule out bacterial infection or co-infection with other viruses. If result is PRESUMPTIVE  POSTIVE SARS-CoV-2 nucleic acids MAY BE PRESENT.   A presumptive positive result was obtained on the submitted specimen  and confirmed on repeat testing.  While 2019 novel coronavirus  (SARS-CoV-2) nucleic acids may be present in the submitted sample  additional confirmatory testing may be necessary for epidemiological  and / or clinical management purposes  to differentiate between  SARS-CoV-2 and other Sarbecovirus currently known to infect humans.  If clinically indicated additional testing with an alternate test  methodology [phone]([id number]) is advised. The SARS-CoV-2 RNA is generally  detectable in upper and lower respiratory sp ecimens during the acute  phase of infection. The expected result is Negative. Fact Sheet for Patients:  BoilerBrush.com.cyhttps://www.fda.gov/media/136312/download Fact Sheet for Healthcare Providers: https://pope.com/https://www.fda.gov/media/136313/download This test is not yet approved or cleared by the [place] FDA and has been authorized for detection and/or diagnosis of SARS-CoV-2 by FDA under an Emergency Use Authorization (EUA).  This EUA will remain in effect (meaning this test can be used) for the duration of the COVID-19 declaration under Section 564(b)(1) of the Act, 21 U.S.C. section 360bbb-3(b)(1), unless the authorization is terminated or revoked sooner. Performed at [facility] [place], [address] [place], [place] [address]      Labs: BNP (last 3 results) No results for input(s): BNP in the last 8760 hours. Basic Metabolic Panel: Recent Labs  Lab 06/25/19 0026 06/26/19 0449  NA 140 140  K 3.2* 3.8  CL 110 116*  CO2 20* 19*  GLUCOSE 93 103*  BUN 19 18  CREATININE 1.42* 1.36*  CALCIUM 9.6 9.2   Liver Function Tests: No results for input(s): AST, ALT, ALKPHOS, BILITOT, PROT, ALBUMIN in the last 168 hours. No results for input(s): LIPASE, AMYLASE in the last 168 hours. No results for input(s): AMMONIA in the last 168 hours. CBC: Recent Labs  Lab  06/25/19 0026  WBC 6.4  NEUTROABS 2.7  HGB 12.2  HCT 41.2  MCV 74.8*  PLT 284   Cardiac Enzymes: No results for input(s): CKTOTAL, CKMB, CKMBINDEX, TROPONINI in the last 168 hours. BNP: Invalid input(s): POCBNP CBG: No results for input(s): GLUCAP in the last 168 hours. D-Dimer No results for input(s): DDIMER in the last 72 hours. Hgb A1c No results for input(s): HGBA1C in the last 72 hours. Lipid Profile No results for input(s): CHOL, HDL, LDLCALC, TRIG, CHOLHDL, LDLDIRECT in the last 72 hours. Thyroid function studies No results for input(s):  TSH, T4TOTAL, T3FREE, THYROIDAB in the last 72 hours.  Invalid input(s): FREET3 Anemia work up No results for input(s): VITAMINB12, FOLATE, FERRITIN, TIBC, IRON, RETICCTPCT in the last 72 hours. Urinalysis No results found for: COLORURINE, APPEARANCEUR, LABSPEC, PHURINE, GLUCOSEU, HGBUR, BILIRUBINUR, KETONESUR, PROTEINUR, UROBILINOGEN, NITRITE, LEUKOCYTESUR Sepsis Labs Invalid input(s): PROCALCITONIN,  WBC,  LACTICIDVEN Microbiology Recent Results (from the past 240 hour(s))  SARS Coronavirus 2 (CEPHEID - Performed in [facility] hospital lab), Hosp Order     Status: None   Collection Time: 06/25/19  4:27 AM   Specimen: Nasopharyngeal Swab  Result Value Ref Range Status   SARS Coronavirus 2 NEGATIVE NEGATIVE Final    Comment: (NOTE) If result is NEGATIVE SARS-CoV-2 target nucleic acids are NOT DETECTED. The SARS-CoV-2 RNA is generally detectable in upper and lower  respiratory specimens during the acute phase of infection. The lowest  concentration of SARS-CoV-2 viral copies this assay can detect is 250  copies / mL. A negative result does not preclude SARS-CoV-2 infection  and should not be used as the sole basis for treatment or other  patient management decisions.  A negative result may occur with  improper specimen collection / handling, submission of specimen other  than nasopharyngeal swab, presence of viral mutation(s)  within the  areas targeted by this assay, and inadequate number of viral copies  (<250 copies / mL). A negative result must be combined with clinical  observations, patient history, and epidemiological information. If result is POSITIVE SARS-CoV-2 target nucleic acids are DETECTED. The SARS-CoV-2 RNA is generally detectable in upper and lower  respiratory specimens dur ing the acute phase of infection.  Positive  results are indicative of active infection with SARS-CoV-2.  Clinical  correlation with patient history and other diagnostic information is  necessary to determine patient infection status.  Positive results do  not rule out bacterial infection or co-infection with other viruses. If result is PRESUMPTIVE POSTIVE SARS-CoV-2 nucleic acids MAY BE PRESENT.   A presumptive positive result was obtained on the submitted specimen  and confirmed on repeat testing.  While 2019 novel coronavirus  (SARS-CoV-2) nucleic acids may be present in the submitted sample  additional confirmatory testing may be necessary for epidemiological  and / or clinical management purposes  to differentiate between  SARS-CoV-2 and other Sarbecovirus currently known to infect humans.  If clinically indicated additional testing with an alternate test  methodology [phone]([id number]) is advised. The SARS-CoV-2 RNA is generally  detectable in upper and lower respiratory sp ecimens during the acute  phase of infection. The expected result is Negative. Fact Sheet for Patients:  BoilerBrush.com.cyhttps://www.fda.gov/media/136312/download Fact Sheet for Healthcare Providers: https://pope.com/https://www.fda.gov/media/136313/download This test is not yet approved or cleared by the [place] FDA and has been authorized for detection and/or diagnosis of SARS-CoV-2 by FDA under an Emergency Use Authorization (EUA).  This EUA will remain in effect (meaning this test can be used) for the duration of the COVID-19 declaration under Section 564(b)(1) of the Act,  21 U.S.C. section 360bbb-3(b)(1), unless the authorization is terminated or revoked sooner. Performed at [facility] [place], [address] [place], [place] [address]      Time coordinating discharge: 25 minutes  SIGNED:   Dorcas CarrowKuber Donnell Beauchamp, MD  [facility] 06/26/2019, 7:50 AM

## 2019-06-26 NOTE — Care Management Obs Status (Signed)
[place] NOTIFICATION   Patient Details  Name: Ingri Diemer MRN: [id number] Date of Birth: 1951-07-14   Medicare Observation Status Notification Given:  Yes    Candie Chroman, LCSW 06/26/2019, 9:26 AM

## 2019-06-26 NOTE — Progress Notes (Signed)
Patient requested to have O2 stated that she uses a C-Pap at [facility] Stats 97% 0n room. Placed on 2L.

## 2020-09-07 IMAGING — CR CHEST - 2 VIEW
2 series · 2 of 2 positions shown · non-contrast
Comparison: None.

CLINICAL DATA: 67 y/o  F; chest pain.

EXAM:
CHEST - 2 VIEW

[w chest pa]
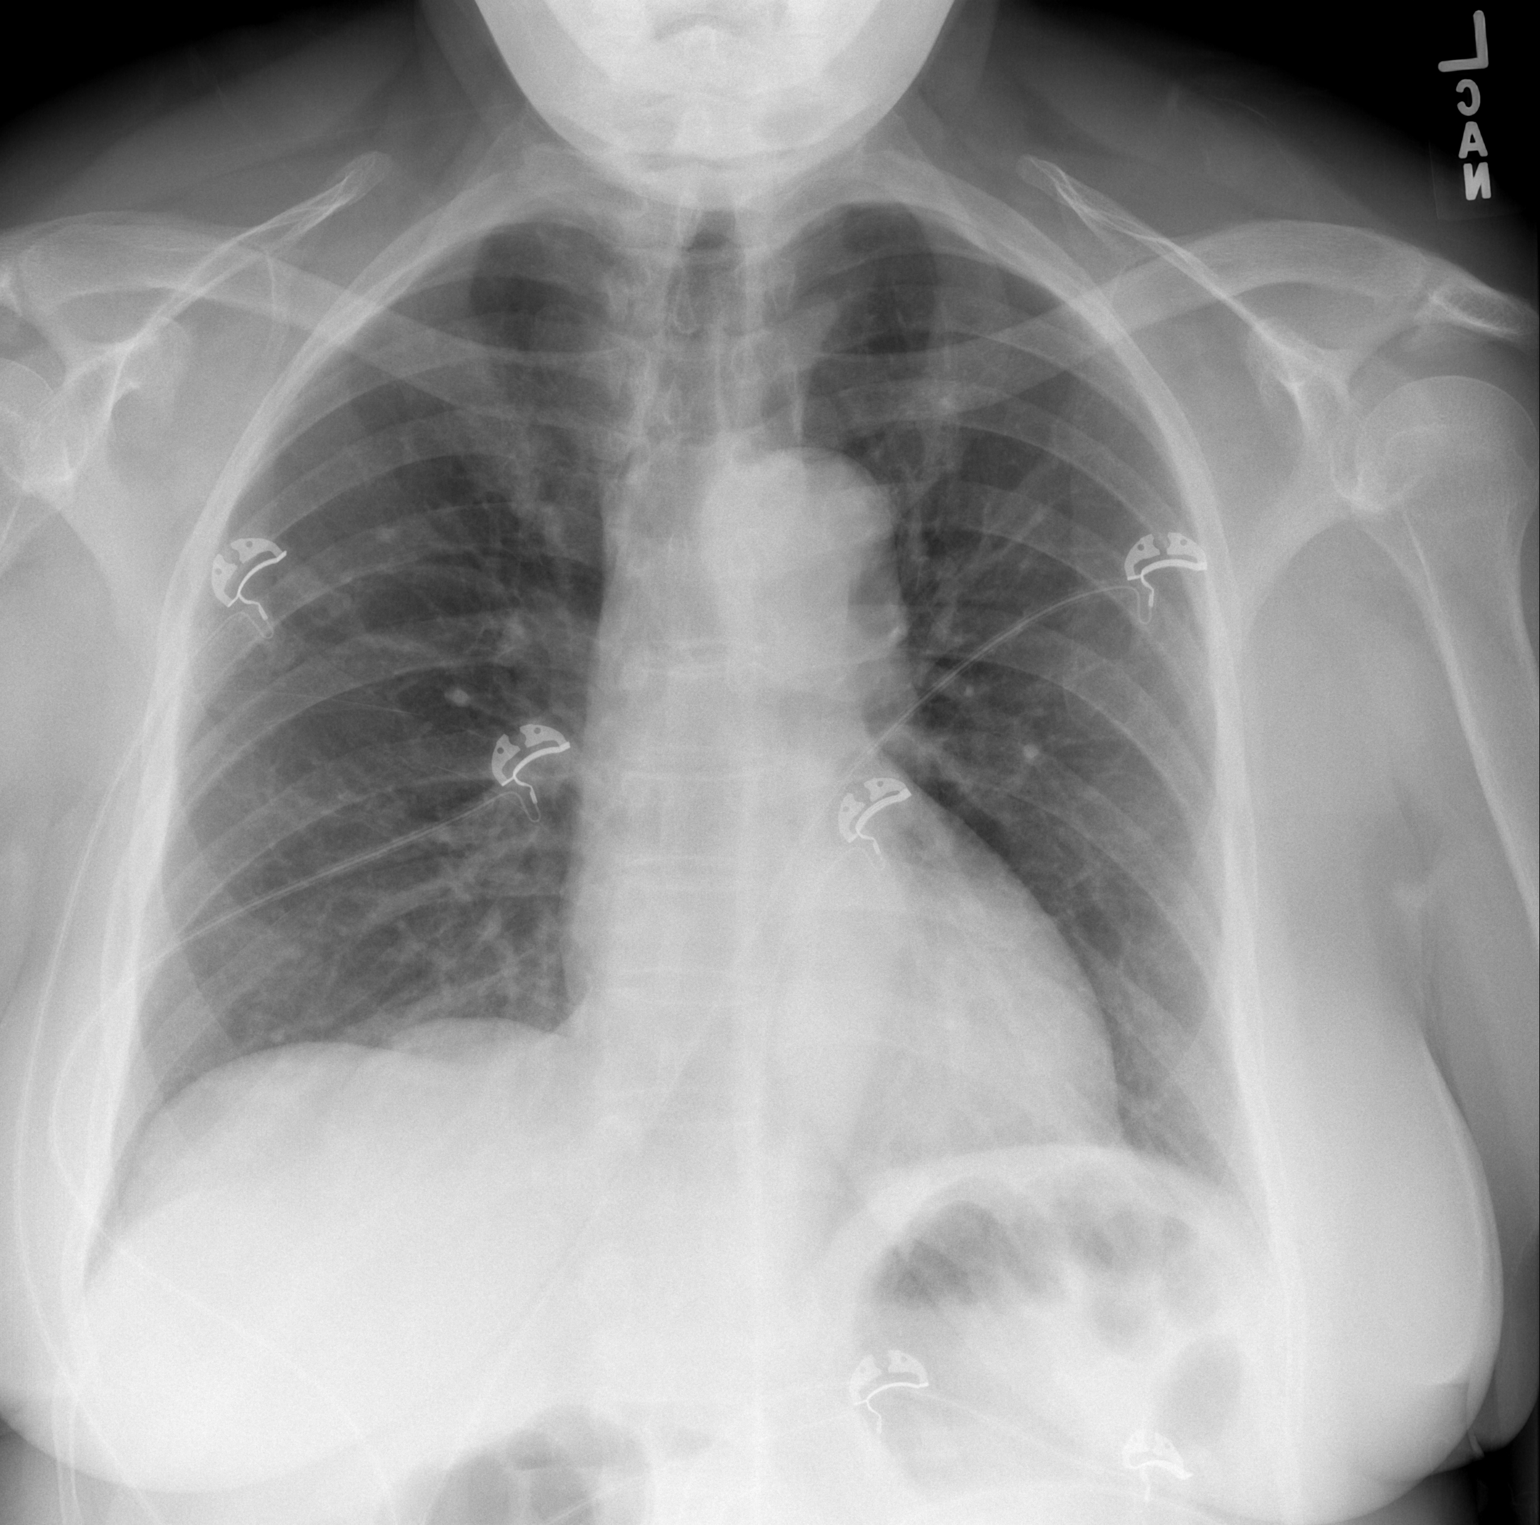

[w chest lat]
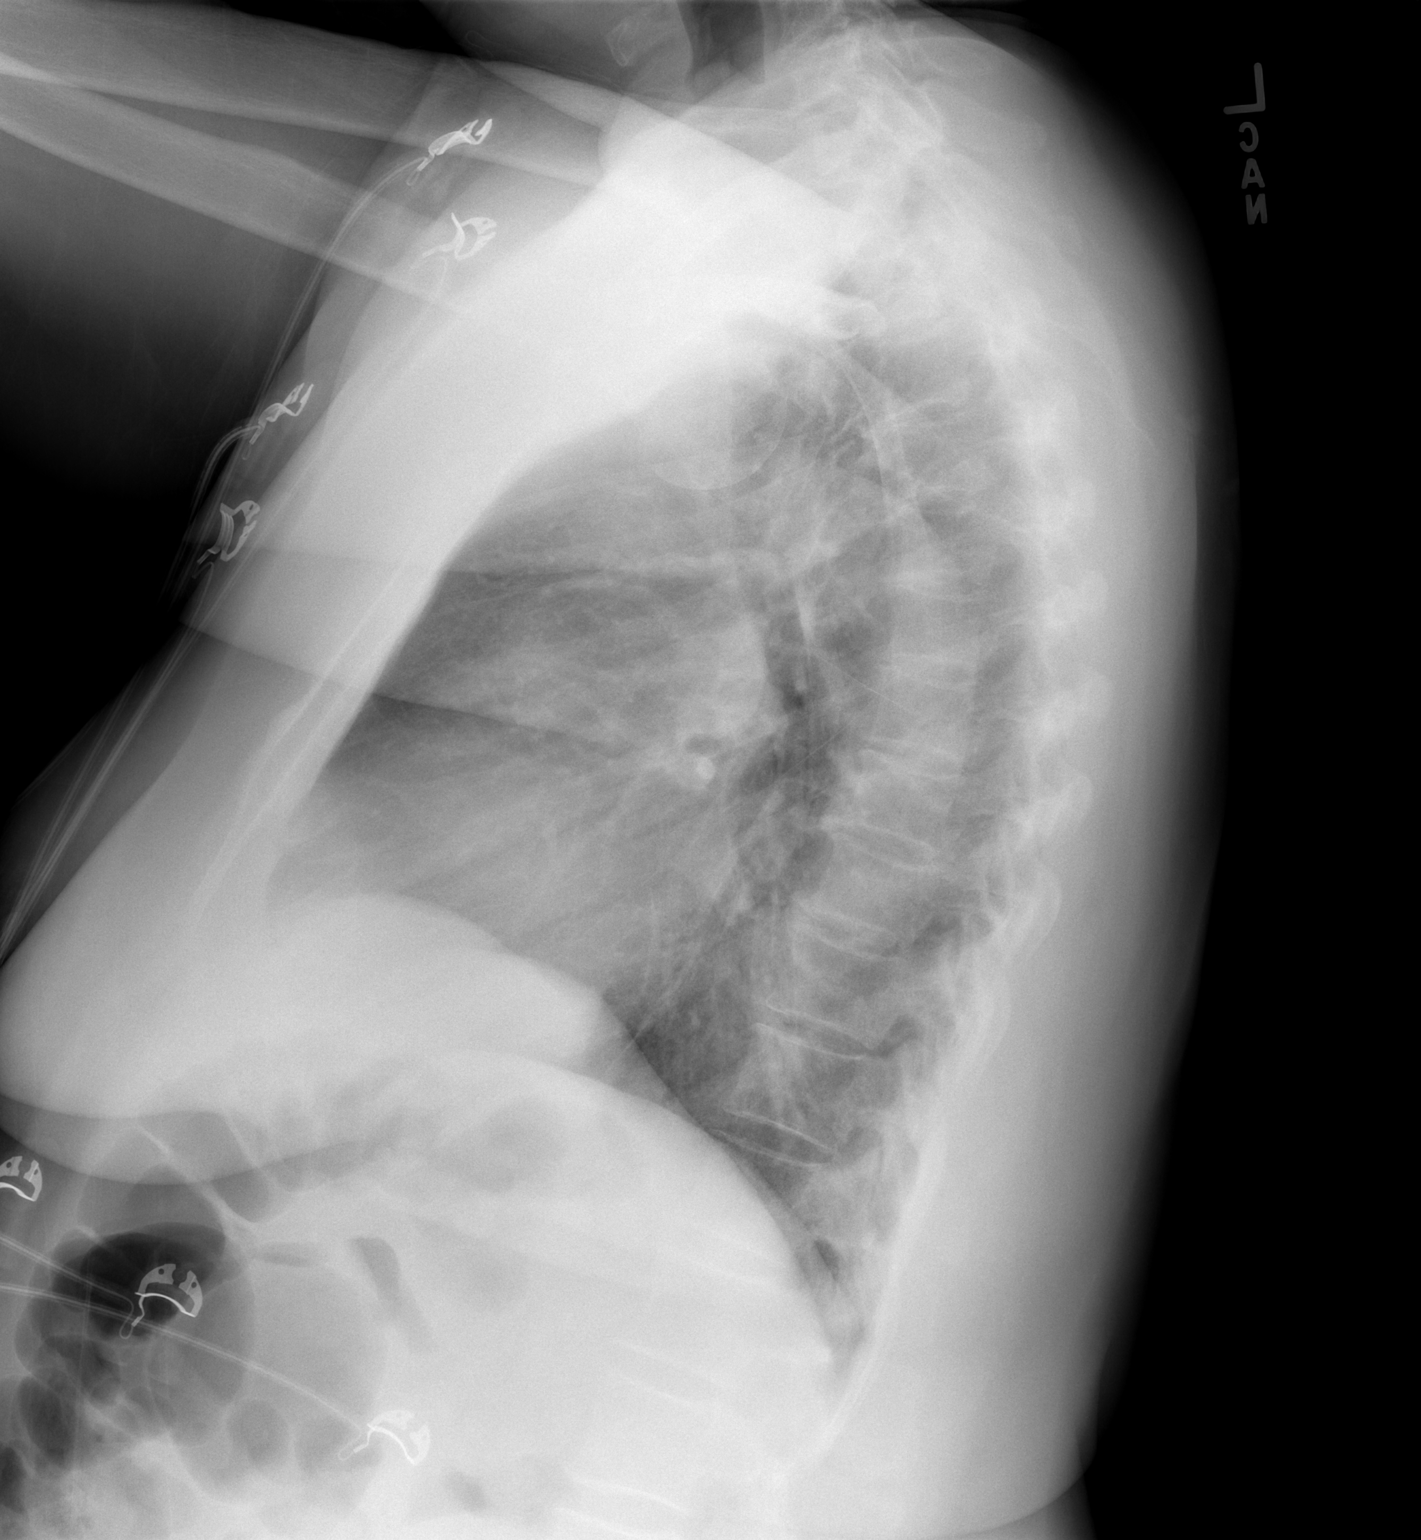

[2 of 2 positions shown; findings below may reference images not displayed]

FINDINGS: The heart size and mediastinal contours are within normal limits.
Both lungs are clear. The visualized skeletal structures are
unremarkable.
IMPRESSION: No acute pulmonary process identified.
